# Patient Record
Sex: Female | Born: 1990 | Race: Black or African American | Hispanic: No | Marital: Single | Smoking: Former smoker
Health system: Southern US, Community
[De-identification: ages and names within clinical notes are randomized; demographics above are authoritative.]

## PROBLEM LIST (undated history)

## (undated) DIAGNOSIS — K219 Gastro-esophageal reflux disease without esophagitis: Secondary | ICD-10-CM

## (undated) DIAGNOSIS — J45909 Unspecified asthma, uncomplicated: Secondary | ICD-10-CM

## (undated) DIAGNOSIS — I10 Essential (primary) hypertension: Secondary | ICD-10-CM

## (undated) DIAGNOSIS — E119 Type 2 diabetes mellitus without complications: Secondary | ICD-10-CM

## (undated) HISTORY — DX: Gastro-esophageal reflux disease without esophagitis: K21.9

## (undated) HISTORY — DX: Essential (primary) hypertension: I10

## (undated) HISTORY — DX: Type 2 diabetes mellitus without complications: E11.9

## (undated) HISTORY — PX: HERNIA REPAIR: SHX51

## (undated) HISTORY — DX: Unspecified asthma, uncomplicated: J45.909

---

## 2010-08-19 ENCOUNTER — Emergency Department: Payer: Self-pay | Admitting: Unknown Physician Specialty

## 2015-08-11 ENCOUNTER — Encounter: Payer: Self-pay | Admitting: Emergency Medicine

## 2015-08-11 ENCOUNTER — Emergency Department
Admission: EM | Admit: 2015-08-11 | Discharge: 2015-08-11 | Disposition: A | Discharge status: Home / Self Care | Payer: BLUE CROSS/BLUE SHIELD | Attending: Emergency Medicine | Admitting: Emergency Medicine

## 2015-08-11 DIAGNOSIS — J0100 Acute maxillary sinusitis, unspecified: Secondary | ICD-10-CM | POA: Insufficient documentation

## 2015-08-11 DIAGNOSIS — F17200 Nicotine dependence, unspecified, uncomplicated: Secondary | ICD-10-CM | POA: Diagnosis not present

## 2015-08-11 DIAGNOSIS — R0981 Nasal congestion: Secondary | ICD-10-CM | POA: Diagnosis present

## 2015-08-11 MED ORDER — HYDROCOD POLST-CPM POLST ER 10-8 MG/5ML PO SUER
5.0000 mL | Freq: Once | ORAL | Status: AC
  Administered 2015-08-11: 5 mL via ORAL
  Filled 2015-08-11: qty 5

## 2015-08-11 MED ORDER — HYDROCOD POLST-CPM POLST ER 10-8 MG/5ML PO SUER: 5.0000 mL | Freq: Two times a day (BID) | ORAL | Status: DC

## 2015-08-11 MED ORDER — AZITHROMYCIN 250 MG PO TABS
500.0000 mg | ORAL_TABLET | Freq: Once | ORAL | Status: AC
  Administered 2015-08-11: 500 mg via ORAL
  Filled 2015-08-11: qty 2

## 2015-08-11 MED ORDER — AMOXICILLIN-POT CLAVULANATE 875-125 MG PO TABS: 1.0000 | ORAL_TABLET | Freq: Two times a day (BID) | ORAL | Status: AC

## 2015-08-11 NOTE — ED Notes (Signed)
Pt reports being sick for the last couple of weeks and has led up to tonight where she is having severe head pain 10/10 and feels she is having difficulty breathing.

## 2015-08-11 NOTE — Discharge Instructions (Signed)
Sinusitis, Adult  Sinusitis is redness, soreness, and inflammation of the paranasal sinuses. Paranasal sinuses are air pockets within the bones of your face. They are located beneath your eyes, in the middle of your forehead, and above your eyes. In healthy paranasal sinuses, mucus is able to drain out, and air is able to circulate through them by way of your nose. However, when your paranasal sinuses are inflamed, mucus and air can become trapped. This can allow bacteria and other germs to grow and cause infection.  Sinusitis can develop quickly and last only a short time (acute) or continue over a long period (chronic). Sinusitis that lasts for more than 12 weeks is considered chronic.  CAUSES  Causes of sinusitis include:  · Allergies.  · Structural abnormalities, such as displacement of the cartilage that separates your nostrils (deviated septum), which can decrease the air flow through your nose and sinuses and affect sinus drainage.  · Functional abnormalities, such as when the small hairs (cilia) that line your sinuses and help remove mucus do not work properly or are not present.  SIGNS AND SYMPTOMS  Symptoms of acute and chronic sinusitis are the same. The primary symptoms are pain and pressure around the affected sinuses. Other symptoms include:  · Upper toothache.  · Earache.  · Headache.  · Bad breath.  · Decreased sense of smell and taste.  · A cough, which worsens when you are lying flat.  · Fatigue.  · Fever.  · Thick drainage from your nose, which often is green and may contain pus (purulent).  · Swelling and warmth over the affected sinuses.  DIAGNOSIS  Your health care provider will perform a physical exam. During your exam, your health care provider may perform any of the following to help determine if you have acute sinusitis or chronic sinusitis:  · Look in your nose for signs of abnormal growths in your nostrils (nasal polyps).  · Tap over the affected sinus to check for signs of  infection.  · View the inside of your sinuses using an imaging device that has a light attached (endoscope).  If your health care provider suspects that you have chronic sinusitis, one or more of the following tests may be recommended:  · Allergy tests.  · Nasal culture. A sample of mucus is taken from your nose, sent to a lab, and screened for bacteria.  · Nasal cytology. A sample of mucus is taken from your nose and examined by your health care provider to determine if your sinusitis is related to an allergy.  TREATMENT  Most cases of acute sinusitis are related to a viral infection and will resolve on their own within 10 days. Sometimes, medicines are prescribed to help relieve symptoms of both acute and chronic sinusitis. These may include pain medicines, decongestants, nasal steroid sprays, or saline sprays.  However, for sinusitis related to a bacterial infection, your health care provider will prescribe antibiotic medicines. These are medicines that will help kill the bacteria causing the infection.  Rarely, sinusitis is caused by a fungal infection. In these cases, your health care provider will prescribe antifungal medicine.  For some cases of chronic sinusitis, surgery is needed. Generally, these are cases in which sinusitis recurs more than 3 times per year, despite other treatments.  HOME CARE INSTRUCTIONS  · Drink plenty of water. Water helps thin the mucus so your sinuses can drain more easily.  · Use a humidifier.  · Inhale steam 3-4 times a day (for   example, sit in the bathroom with the shower running).  · Apply a warm, moist washcloth to your face 3-4 times a day, or as directed by your health care provider.  · Use saline nasal sprays to help moisten and clean your sinuses.  · Take medicines only as directed by your health care provider.  · If you were prescribed either an antibiotic or antifungal medicine, finish it all even if you start to feel better.  SEEK IMMEDIATE MEDICAL CARE IF:  · You have  increasing pain or severe headaches.  · You have nausea, vomiting, or drowsiness.  · You have swelling around your face.  · You have vision problems.  · You have a stiff neck.  · You have difficulty breathing.     This information is not intended to replace advice given to you by your health care provider. Make sure you discuss any questions you have with your health care provider.     Document Released: 09/02/2005 Document Revised: 09/23/2014 Document Reviewed: 09/17/2011  Elsevier Interactive Patient Education ©2016 [employer]  Sinus Rinse  WHAT IS A SINUS RINSE?  A sinus rinse is a simple home treatment that is used to rinse your sinuses with a sterile mixture of salt and water (saline solution). Sinuses are air-filled spaces in your skull behind the bones of your face and forehead that open into your nasal cavity.  You will use the following:  · Saline solution.  · Neti pot or spray bottle. This releases the saline solution into your nose and through your sinuses. Neti pots and spray bottles can be purchased at your local pharmacy, a health food store, or online.  WHEN WOULD I DO A SINUS RINSE?  A sinus rinse can help to clear mucus, dirt, dust, or pollen from the nasal cavity. You may do a sinus rinse when you have a cold, a virus, nasal allergy symptoms, a sinus infection, or stuffiness in the nose or sinuses.  If you are considering a sinus rinse:  · Ask your child's health care provider before performing a sinus rinse on your child.  · Do not do a sinus rinse if you have had ear or nasal surgery, ear infection, or blocked ears.  HOW DO I DO A SINUS RINSE?  · Wash your hands.  · Disinfect your device according to the directions provided and then dry it.  · Use the solution that comes with your device or one that is sold separately in stores. Follow the mixing directions on the package.  · Fill your device with the amount of saline solution as directed by the device instructions.  · Stand over a sink and  tilt your head sideways over the sink.  · Place the spout of the device in your upper nostril (the one closer to the ceiling).  · Gently pour or squeeze the saline solution into the nasal cavity. The liquid should drain to the lower nostril if you are not overly congested.  · Gently blow your nose. Blowing too hard may cause ear pain.  · Repeat in the other nostril.  · Clean and rinse your device with clean water and then air-dry it.  ARE THERE RISKS OF A SINUS RINSE?   Sinus rinse is generally very safe and effective. However, there are a few risks, which include:   · A burning sensation in the sinuses. This may happen if you do not make the saline solution as directed. Make sure to follow all directions when   making the saline solution.  · Infection from contaminated water. This is rare, but possible.  · Nasal irritation.     This information is not intended to replace advice given to you by your health care provider. Make sure you discuss any questions you have with your health care provider.     Document Released: 03/30/2014 Document Reviewed: 03/30/2014  Elsevier Interactive Patient Education ©2016 [employer]

## 2015-08-11 NOTE — ED Provider Notes (Signed)
[facility] Provider Note     Time seen: ----------------------------------------- 8:56 PM on 08/11/2015 -----------------------------------------    I have reviewed the triage vital signs and the nursing notes.   HISTORY  Chief Complaint Nasal Congestion and Headache    HPI Betty Villarreal is a 24 y.o. female who presents to ER after she has been sick for several weeks with nasal ingestion and cough. She said severe headache 10 out of 10 and she feels like she's having difficulty breathing with sinus pressure and congestion. Patient states over-the-counter medicine hasn't helped.   History reviewed. No pertinent past medical history.  There are no active problems to display for this patient.   Past Surgical History  Procedure Laterality Date  . Hernia repair      Allergies Review of patient's allergies indicates no known allergies.  Social History Social History  Substance Use Topics  . Smoking status: Light Tobacco Smoker  . Smokeless tobacco: None  . Alcohol Use: No    Review of Systems Constitutional: Negative for fever. Eyes: Negative for visual changes. ENT: Positive for nasal congestion Cardiovascular: Negative for chest pain. Respiratory: Negative for shortness of breath. Positive for cough Gastrointestinal: Negative for abdominal pain, vomiting and diarrhea. Genitourinary: Negative for dysuria. Musculoskeletal: Negative for back pain. Skin: Negative for rash. Neurological: Positive for headaches, negative for focal weakness or numbness.  10-point ROS otherwise negative.  ____________________________________________   PHYSICAL EXAM:  VITAL SIGNS: ED Triage Vitals  Enc Vitals Group     BP 08/11/15 2046 157/96 mmHg     Pulse Rate 08/11/15 2046 100     Resp 08/11/15 2046 20     Temp 08/11/15 2046 98.2 F (36.8 C)     Temp Source 08/11/15 2046 Oral     SpO2 08/11/15 2046 100 %     Weight 08/11/15  2046 215 lb (97.523 kg)     Height 08/11/15 2046  (1.676 m)     Head Cir --      Peak Flow --      Pain Score 08/11/15 2047 10     Pain Loc --      Pain Edu? --      Excl. in GC? --     Constitutional: Alert and oriented. Well appearing and in no distress. Eyes: Conjunctivae are normal. PERRL. Normal extraocular movements. ENT   Head: Normocephalic and atraumatic.   Nose: Moderate to severe nasal passage congestion and erythema   Mouth/Throat: Mucous membranes are moist.   Neck: No stridor. Cardiovascular: Normal rate, regular rhythm. Normal and symmetric distal pulses are present in all extremities. No murmurs, rubs, or gallops. Respiratory: Normal respiratory effort without tachypnea nor retractions. Breath sounds are clear and equal bilaterally. No wheezes/rales/rhonchi. Gastrointestinal: Soft and nontender. No distention. No abdominal bruits.  Musculoskeletal: Nontender with normal range of motion in all extremities. No joint effusions.  No lower extremity tenderness nor edema. Neurologic:  Normal speech and language. No gross focal neurologic deficits are appreciated. Speech is normal. No gait instability. Skin:  Skin is warm, dry and intact. No rash noted. Psychiatric: Mood and affect are normal. Speech and behavior are normal. Patient exhibits appropriate insight and judgment. ____________________________________________  ED COURSE:  Pertinent labs & imaging results that were available during my care of the patient were reviewed by me and considered in my medical decision making (see chart for details). Patient's in no acute distress, will place on antibiotics and cough suppressants or decongestants.  ___________________________________________  FINAL ASSESSMENT AND PLAN  Sinusitis  Plan: Patient with labs and imaging as dictated above. Patient with 2 weeks of upper respiratory symptoms and sinusitis. Will place on antibiotics and give cough suppressants  with decongestants.   Emily FilbertWilliams, Jonathan E, MD   Emily FilbertJonathan E Williams, MD 08/11/15 [phone]

## 2015-10-31 ENCOUNTER — Emergency Department
Admission: EM | Admit: 2015-10-31 | Discharge: 2015-10-31 | Disposition: A | Discharge status: Home / Self Care | Payer: BLUE CROSS/BLUE SHIELD | Attending: Emergency Medicine | Admitting: Emergency Medicine

## 2015-10-31 ENCOUNTER — Emergency Department: Payer: BLUE CROSS/BLUE SHIELD

## 2015-10-31 ENCOUNTER — Encounter: Payer: Self-pay | Admitting: *Deleted

## 2015-10-31 DIAGNOSIS — Z79899 Other long term (current) drug therapy: Secondary | ICD-10-CM | POA: Diagnosis not present

## 2015-10-31 DIAGNOSIS — F17200 Nicotine dependence, unspecified, uncomplicated: Secondary | ICD-10-CM | POA: Insufficient documentation

## 2015-10-31 DIAGNOSIS — O9989 Other specified diseases and conditions complicating pregnancy, childbirth and the puerperium: Secondary | ICD-10-CM | POA: Diagnosis not present

## 2015-10-31 DIAGNOSIS — Z3A08 8 weeks gestation of pregnancy: Secondary | ICD-10-CM | POA: Diagnosis not present

## 2015-10-31 DIAGNOSIS — O26899 Other specified pregnancy related conditions, unspecified trimester: Secondary | ICD-10-CM

## 2015-10-31 DIAGNOSIS — O210 Mild hyperemesis gravidarum: Secondary | ICD-10-CM | POA: Diagnosis not present

## 2015-10-31 DIAGNOSIS — Z3491 Encounter for supervision of normal pregnancy, unspecified, first trimester: Secondary | ICD-10-CM

## 2015-10-31 DIAGNOSIS — R102 Pelvic and perineal pain: Secondary | ICD-10-CM | POA: Diagnosis not present

## 2015-10-31 DIAGNOSIS — O99331 Smoking (tobacco) complicating pregnancy, first trimester: Secondary | ICD-10-CM | POA: Insufficient documentation

## 2015-10-31 LAB — URINALYSIS COMPLETE WITH MICROSCOPIC (ARMC ONLY)
Bacteria, UA: NONE SEEN
Bilirubin Urine: NEGATIVE
Glucose, UA: NEGATIVE mg/dL
HGB URINE DIPSTICK: NEGATIVE
Ketones, ur: NEGATIVE mg/dL
LEUKOCYTES UA: NEGATIVE
NITRITE: NEGATIVE
PH: 6 (ref 5.0–8.0)
PROTEIN: NEGATIVE mg/dL
RBC / HPF: NONE SEEN RBC/hpf (ref 0–5)
SPECIFIC GRAVITY, URINE: 1.018 (ref 1.005–1.030)

## 2015-10-31 LAB — COMPREHENSIVE METABOLIC PANEL
ALK PHOS: 59 U/L (ref 38–126)
ALT: 11 U/L — AB (ref 14–54)
AST: 12 U/L — AB (ref 15–41)
Albumin: 3.5 g/dL (ref 3.5–5.0)
Anion gap: 6 (ref 5–15)
BILIRUBIN TOTAL: 0.2 mg/dL — AB (ref 0.3–1.2)
BUN: 9 mg/dL (ref 6–20)
CALCIUM: 9.1 mg/dL (ref 8.9–10.3)
CO2: 26 mmol/L (ref 22–32)
CREATININE: 0.61 mg/dL (ref 0.44–1.00)
Chloride: 103 mmol/L (ref 101–111)
Glucose, Bld: 152 mg/dL — ABNORMAL HIGH (ref 65–99)
Potassium: 4 mmol/L (ref 3.5–5.1)
Sodium: 135 mmol/L (ref 135–145)
TOTAL PROTEIN: 7.9 g/dL (ref 6.5–8.1)

## 2015-10-31 LAB — POCT PREGNANCY, URINE: PREG TEST UR: POSITIVE — AB

## 2015-10-31 LAB — CBC
HCT: 34.3 % — ABNORMAL LOW (ref 35.0–47.0)
Hemoglobin: 11.2 g/dL — ABNORMAL LOW (ref 12.0–16.0)
MCH: 25.9 pg — ABNORMAL LOW (ref 26.0–34.0)
MCHC: 32.5 g/dL (ref 32.0–36.0)
MCV: 79.6 fL — AB (ref 80.0–100.0)
PLATELETS: 220 10*3/uL (ref 150–440)
RBC: 4.31 MIL/uL (ref 3.80–5.20)
RDW: 14.6 % — AB (ref 11.5–14.5)
WBC: 12.9 10*3/uL — AB (ref 3.6–11.0)

## 2015-10-31 LAB — LIPASE, BLOOD: Lipase: 35 U/L (ref 11–51)

## 2015-10-31 LAB — HCG, QUANTITATIVE, PREGNANCY: HCG, BETA CHAIN, QUANT, S: 33863 m[IU]/mL — AB (ref <5)

## 2015-10-31 NOTE — ED Notes (Signed)
Pt reports she has low abd pain and vomited.  Sx for 1 month.  Vomited x 2 today.  Intermittent back pain.  No vag bleeding.

## 2015-10-31 NOTE — Discharge Instructions (Signed)
First Trimester of Pregnancy The first trimester of pregnancy is from week 1 until the end of week 12 (months 1 through 3). A week after a sperm fertilizes an egg, the egg will implant on the wall of the uterus. This embryo will begin to develop into a baby. Genes from you and your partner are forming the baby. The female genes determine whether the baby is a boy or a girl. At 6-8 weeks, the eyes and face are formed, and the heartbeat can be seen on ultrasound. At the end of 12 weeks, all the baby's organs are formed.  Now that you are pregnant, you will want to do everything you can to have a healthy baby. Two of the most important things are to get good prenatal care and to follow your health care provider's instructions. Prenatal care is all the medical care you receive before the baby's birth. This care will help prevent, find, and treat any problems during the pregnancy and childbirth. BODY CHANGES Your body goes through many changes during pregnancy. The changes vary from woman to woman.   You may gain or lose a couple of pounds at first.  You may feel sick to your stomach (nauseous) and throw up (vomit). If the vomiting is uncontrollable, call your health care provider.  You may tire easily.  You may develop headaches that can be relieved by medicines approved by your health care provider.  You may urinate more often. Painful urination may mean you have a bladder infection.  You may develop heartburn as a result of your pregnancy.  You may develop constipation because certain hormones are causing the muscles that push waste through your intestines to slow down.  You may develop hemorrhoids or swollen, bulging veins (varicose veins).  Your breasts may begin to grow larger and become tender. Your nipples may stick out more, and the tissue that surrounds them (areola) may become darker.  Your gums may bleed and may be sensitive to brushing and flossing.  Dark spots or blotches (chloasma,  mask of pregnancy) may develop on your face. This will likely fade after the baby is born.  Your menstrual periods will stop.  You may have a loss of appetite.  You may develop cravings for certain kinds of food.  You may have changes in your emotions from day to day, such as being excited to be pregnant or being concerned that something may go wrong with the pregnancy and baby.  You may have more vivid and strange dreams.  You may have changes in your hair. These can include thickening of your hair, rapid growth, and changes in texture. Some women also have hair loss during or after pregnancy, or hair that feels dry or thin. Your hair will most likely return to normal after your baby is born. WHAT TO EXPECT AT YOUR PRENATAL VISITS During a routine prenatal visit:  You will be weighed to make sure you and the baby are growing normally.  Your blood pressure will be taken.  Your abdomen will be measured to track your baby's growth.  The fetal heartbeat will be listened to starting around week 10 or 12 of your pregnancy.  Test results from any previous visits will be discussed. Your health care provider may ask you:  How you are feeling.  If you are feeling the baby move.  If you have had any abnormal symptoms, such as leaking fluid, bleeding, severe headaches, or abdominal cramping.  If you are using any tobacco products,   including cigarettes, chewing tobacco, and electronic cigarettes.  If you have any questions. Other tests that may be performed during your first trimester include:  Blood tests to find your blood type and to check for the presence of any previous infections. They will also be used to check for low iron levels (anemia) and Rh antibodies. Later in the pregnancy, blood tests for diabetes will be done along with other tests if problems develop.  Urine tests to check for infections, diabetes, or protein in the urine.  An ultrasound to confirm the proper growth  and development of the baby.  An amniocentesis to check for possible genetic problems.  Fetal screens for spina bifida and Down syndrome.  You may need other tests to make sure you and the baby are doing well.  HIV (human immunodeficiency virus) testing. Routine prenatal testing includes screening for HIV, unless you choose not to have this test. HOME CARE INSTRUCTIONS  Medicines  Follow your health care provider's instructions regarding medicine use. Specific medicines may be either safe or unsafe to take during pregnancy.  Take your prenatal vitamins as directed.  If you develop constipation, try taking a stool softener if your health care provider approves. Diet  Eat regular, well-balanced meals. Choose a variety of foods, such as meat or vegetable-based protein, fish, milk and low-fat dairy products, vegetables, fruits, and whole grain breads and cereals. Your health care provider will help you determine the amount of weight gain that is right for you.  Avoid raw meat and uncooked cheese. These carry germs that can cause birth defects in the baby.  Eating four or five small meals rather than three large meals a day may help relieve nausea and vomiting. If you start to feel nauseous, eating a few soda crackers can be helpful. Drinking liquids between meals instead of during meals also seems to help nausea and vomiting.  If you develop constipation, eat more high-fiber foods, such as fresh vegetables or fruit and whole grains. Drink enough fluids to keep your urine clear or pale yellow. Activity and Exercise  Exercise only as directed by your health care provider. Exercising will help you:  Control your weight.  Stay in shape.  Be prepared for labor and delivery.  Experiencing pain or cramping in the lower abdomen or low back is a good sign that you should stop exercising. Check with your health care provider before continuing normal exercises.  Try to avoid standing for long  periods of time. Move your legs often if you must stand in one place for a long time.  Avoid heavy lifting.  Wear low-heeled shoes, and practice good posture.  You may continue to have sex unless your health care provider directs you otherwise. Relief of Pain or Discomfort  Wear a good support bra for breast tenderness.   Take warm sitz baths to soothe any pain or discomfort caused by hemorrhoids. Use hemorrhoid cream if your health care provider approves.   Rest with your legs elevated if you have leg cramps or low back pain.  If you develop varicose veins in your legs, wear support hose. Elevate your feet for 15 minutes, 3-4 times a day. Limit salt in your diet. Prenatal Care  Schedule your prenatal visits by the twelfth week of pregnancy. They are usually scheduled monthly at first, then more often in the last 2 months before delivery.  Write down your questions. Take them to your prenatal visits.  Keep all your prenatal visits as directed by your   health care provider. Safety  Wear your seat belt at all times when driving.  Make a list of emergency phone numbers, including numbers for family, friends, the hospital, and police and fire departments. General Tips  Ask your health care provider for a referral to a local prenatal education class. Begin classes no later than at the beginning of month 6 of your pregnancy.  Ask for help if you have counseling or nutritional needs during pregnancy. Your health care provider can offer advice or refer you to specialists for help with various needs.  Do not use hot tubs, steam rooms, or saunas.  Do not douche or use tampons or scented sanitary pads.  Do not cross your legs for long periods of time.  Avoid cat litter boxes and soil used by cats. These carry germs that can cause birth defects in the baby and possibly loss of the fetus by miscarriage or stillbirth.  Avoid all smoking, herbs, alcohol, and medicines not prescribed by  your health care provider. Chemicals in these affect the formation and growth of the baby.  Do not use any tobacco products, including cigarettes, chewing tobacco, and electronic cigarettes. If you need help quitting, ask your health care provider. You may receive counseling support and other resources to help you quit.  Schedule a dentist appointment. At home, brush your teeth with a soft toothbrush and be gentle when you floss. SEEK MEDICAL CARE IF:   You have dizziness.  You have mild pelvic cramps, pelvic pressure, or nagging pain in the abdominal area.  You have persistent nausea, vomiting, or diarrhea.  You have a bad smelling vaginal discharge.  You have pain with urination.  You notice increased swelling in your face, hands, legs, or ankles. SEEK IMMEDIATE MEDICAL CARE IF:   You have a fever.  You are leaking fluid from your vagina.  You have spotting or bleeding from your vagina.  You have severe abdominal cramping or pain.  You have rapid weight gain or loss.  You vomit blood or material that looks like coffee grounds.  You are exposed to German measles and have never had them.  You are exposed to fifth disease or chickenpox.  You develop a severe headache.  You have shortness of breath.  You have any kind of trauma, such as from a fall or a car accident.   This information is not intended to replace advice given to you by your health care provider. Make sure you discuss any questions you have with your health care provider.   Document Released: 08/27/2001 Document Revised: 09/23/2014 Document Reviewed: 07/13/2013 Elsevier Interactive Patient Education 2016 [employer]  

## 2015-10-31 NOTE — ED Provider Notes (Signed)
[facility] Emergency Department Provider Note  ____________________________________________  Time seen: 4:00 AM  I have reviewed the triage vital signs and the nursing notes.   HISTORY  Chief Complaint Emesis      HPI Betty Villarreal is a 25 y.o. female presents with low pelvic discomfort and vomiting times one month. Patient denies any fever no nausea vomiting no vaginal discharge or bleeding. Patient denies any urinary symptoms. Of note patient's last menstrual period was December 2016. Patient admits to unprotected sexual intercourse     No past medical history on file.  There are no active problems to display for this patient.   Past Surgical History  Procedure Laterality Date  . Hernia repair      Current Outpatient Rx  Name  Route  Sig  Dispense  Refill  . chlorpheniramine-HYDROcodone (TUSSIONEX PENNKINETIC ER) 10-8 MG/5ML SUER   Oral   Take 5 mLs by mouth 2 (two) times daily.   140 mL   0     Allergies Review of patient's allergies indicates no known allergies.  No family history on file.  Social History Social History  Substance Use Topics  . Smoking status: Light Tobacco Smoker  . Smokeless tobacco: None  . Alcohol Use: No    Review of Systems  Constitutional: Negative for fever. Eyes: Negative for visual changes. ENT: Negative for sore throat. Cardiovascular: Negative for chest pain. Respiratory: Negative for shortness of breath. Gastrointestinal: Negative for abdominal pain and diarrhea. Positive for vomiting Genitourinary: Positive for pelvic pain Musculoskeletal: Negative for back pain. Skin: Negative for rash. Neurological: Negative for headaches, focal weakness or numbness.   10-point ROS otherwise negative.  ____________________________________________   PHYSICAL EXAM:  VITAL SIGNS: ED Triage Vitals  Enc Vitals Group     BP 10/31/15 0123 141/72 mmHg     Pulse Rate 10/31/15 0123 84     Resp  10/31/15 0123 18     Temp 10/31/15 0123 98.2 F (36.8 C)     Temp Source 10/31/15 0123 Oral     SpO2 10/31/15 0123 98 %     Weight 10/31/15 0123 215 lb (97.523 kg)     Height 10/31/15 0123  (1.676 m)     Head Cir --      Peak Flow --      Pain Score 10/31/15 0125 6     Pain Loc --      Pain Edu? --      Excl. in GC? --      Constitutional: Alert and oriented. Well appearing and in no distress. Eyes: Conjunctivae are normal. PERRL. Normal extraocular movements. ENT   Head: Normocephalic and atraumatic.   Nose: No congestion/rhinnorhea.   Mouth/Throat: Mucous membranes are moist.   Neck: No stridor. Hematological/Lymphatic/Immunilogical: No cervical lymphadenopathy. Cardiovascular: Normal rate, regular rhythm. Normal and symmetric distal pulses are present in all extremities. No murmurs, rubs, or gallops. Respiratory: Normal respiratory effort without tachypnea nor retractions. Breath sounds are clear and equal bilaterally. No wheezes/rales/rhonchi. Gastrointestinal: Soft and nontender. No distention. There is no CVA tenderness. Genitourinary: deferred Musculoskeletal: Nontender with normal range of motion in all extremities. No joint effusions.  No lower extremity tenderness nor edema. Neurologic:  Normal speech and language. No gross focal neurologic deficits are appreciated. Speech is normal.  Skin:  Skin is warm, dry and intact. No rash noted. Psychiatric: Mood and affect are normal. Speech and behavior are normal. Patient exhibits appropriate insight and judgment.  ____________________________________________  LABS (pertinent positives/negatives)  Labs Reviewed  COMPREHENSIVE METABOLIC PANEL - Abnormal; Notable for the following:    Glucose, Bld 152 (*)    AST 12 (*)    ALT 11 (*)    Total Bilirubin 0.2 (*)    All other components within normal limits  CBC - Abnormal; Notable for the following:    WBC 12.9 (*)    Hemoglobin 11.2 (*)    HCT 34.3  (*)    MCV 79.6 (*)    MCH 25.9 (*)    RDW 14.6 (*)    All other components within normal limits  URINALYSIS COMPLETEWITH MICROSCOPIC ([facility] ONLY) - Abnormal; Notable for the following:    Color, Urine YELLOW (*)    APPearance CLEAR (*)    Squamous Epithelial / LPF 0-5 (*)    All other components within normal limits  HCG, QUANTITATIVE, PREGNANCY - Abnormal; Notable for the following:    hCG, Beta Chain, Quant, [place] [address] (*)    All other components within normal limits  POCT PREGNANCY, URINE - Abnormal; Notable for the following:    Preg Test, Ur POSITIVE (*)    All other components within normal limits  LIPASE, BLOOD  POC URINE PREG, ED      RADIOLOGY US Ob Transvaginal (Final result) Result time: 10/31/15 06:22:40   Final result by Rad Results In Interface (10/31/15 06:22:40)   Narrative:   CLINICAL DATA: New diagnosis pregnancy. Vomiting. Estimated gestational age by LMP is 8 weeks 5 days. Quantitative beta HCG is 33,863.  EXAM: OBSTETRIC <14 WK [place] AND TRANSVAGINAL OB US  TECHNIQUE: Both transabdominal and transvaginal ultrasound examinations were performed for complete evaluation of the gestation as well as the maternal uterus, adnexal regions, and pelvic cul-de-sac. Transvaginal technique was performed to assess early pregnancy.  COMPARISON: None.  FINDINGS: Intrauterine gestational sac: A single intrauterine pregnancy is identified.  Yolk sac: Yolk sac is present.  Embryo: Fetal pole is present.  Cardiac Activity: Fetal cardiac activity is observed.  Heart Rate: 168 bpm  CRL: 13.5 mm  7 w  4 d         [place] EDC: 06/14/2016  Subchorionic hemorrhage: None visualized.  Maternal uterus/adnexae: Uterus is retroverted. No myometrial mass lesions are appreciated. Small nabothian cysts in the cervix. Both ovaries are identified and demonstrate normal follicular changes. No abnormal adnexal masses. No free pelvic fluid.  IMPRESSION: Single  intrauterine pregnancy. Estimated gestational age by crown-rump length is 7 weeks 4 days. No acute complication is suggested.   Electronically Signed By: Burman Nieves M.D. On: 10/31/2015 06:22          US OB Comp Less 14 Wks (Final result) Result time: 10/31/15 06:22:40   Procedure changed from [place] MFM OB Comp Less 14 Wks      Final result by Rad Results In Interface (10/31/15 06:22:40)   Narrative:   CLINICAL DATA: New diagnosis pregnancy. Vomiting. Estimated gestational age by LMP is 8 weeks 5 days. Quantitative beta HCG is 33,863.  EXAM: OBSTETRIC <14 WK [place] AND TRANSVAGINAL OB US  TECHNIQUE: Both transabdominal and transvaginal ultrasound examinations were performed for complete evaluation of the gestation as well as the maternal uterus, adnexal regions, and pelvic cul-de-sac. Transvaginal technique was performed to assess early pregnancy.  COMPARISON: None.  FINDINGS: Intrauterine gestational sac: A single intrauterine pregnancy is identified.  Yolk sac: Yolk sac is present.  Embryo: Fetal pole is present.  Cardiac Activity: Fetal cardiac activity is observed.  Heart Rate: 168  bpm  CRL: 13.5 mm  7 w  4 d         [place] EDC: 06/14/2016  Subchorionic hemorrhage: None visualized.  Maternal uterus/adnexae: Uterus is retroverted. No myometrial mass lesions are appreciated. Small nabothian cysts in the cervix. Both ovaries are identified and demonstrate normal follicular changes. No abnormal adnexal masses. No free pelvic fluid.  IMPRESSION: Single intrauterine pregnancy. Estimated gestational age by crown-rump length is 7 weeks 4 days. No acute complication is suggested.   Electronically Signed By: Burman Nieves M.D. On: 10/31/2015 06:22         _   INITIAL IMPRESSION / ASSESSMENT AND PLAN / ED COURSE  Pertinent labs & imaging results that were available during my care of the patient were reviewed by me and  considered in my medical decision making (see chart for details).    ____________________________________________   FINAL CLINICAL IMPRESSION(S) / ED DIAGNOSES  Final diagnoses:  Pelvic pain affecting pregnancy  First trimester pregnancy      Darci Current, MD 10/31/15 [phone]

## 2015-10-31 NOTE — ED Notes (Signed)
Patient with no complaints at this time. Respirations even and unlabored. Skin warm/dry. Discharge instructions reviewed with patient at this time. Patient given opportunity to voice concerns/ask questions. Patient discharged at this time and left Emergency Department with steady gait.   

## 2015-10-31 NOTE — ED Notes (Signed)
poct pregnancy Positive 

## 2015-11-09 ENCOUNTER — Ambulatory Visit (INDEPENDENT_AMBULATORY_CARE_PROVIDER_SITE_OTHER): Payer: Medicaid Other | Admitting: Obstetrics and Gynecology

## 2015-11-09 VITALS — BP 126/74 | HR 92 | Wt 229.0 lb

## 2015-11-09 DIAGNOSIS — Z1389 Encounter for screening for other disorder: Secondary | ICD-10-CM

## 2015-11-09 DIAGNOSIS — R7303 Prediabetes: Secondary | ICD-10-CM

## 2015-11-09 DIAGNOSIS — Z113 Encounter for screening for infections with a predominantly sexual mode of transmission: Secondary | ICD-10-CM

## 2015-11-09 DIAGNOSIS — Z331 Pregnant state, incidental: Secondary | ICD-10-CM

## 2015-11-09 DIAGNOSIS — Z36 Encounter for antenatal screening of mother: Secondary | ICD-10-CM

## 2015-11-09 DIAGNOSIS — Z3490 Encounter for supervision of normal pregnancy, unspecified, unspecified trimester: Secondary | ICD-10-CM

## 2015-11-09 DIAGNOSIS — R638 Other symptoms and signs concerning food and fluid intake: Secondary | ICD-10-CM

## 2015-11-09 DIAGNOSIS — Z369 Encounter for antenatal screening, unspecified: Secondary | ICD-10-CM

## 2015-11-09 NOTE — Progress Notes (Signed)
Betty Villarreal presents for NOB nurse interview visit. G-1. P-0. Pt seen in ER on 10/31/2015 for pelvic pain and vomiting and found out she was pregnant.UPT: positive, Bhcg: 11,863. Pregnancy education material explained and given. No cats in the home. NOB labs ordered. TSH/HbgA1c due to Increased BMI, pt states she "had" diabetes and was to follow diet and thinks it may have been borderline. HIV labs and Drug screen were explained optional and she could opt out of tests but did not decline. Drug screen ordered. PNV encouraged. NT to discuss with provider. Pt. To follow up with provider in 3 weeks for NOB physical. FOB was killed Monday. Pt to come in tomorrow to see provider to discuss some depression and anxiety.  All questions answered.   ZIKA EXPOSURE SCREEN:  The patient has not traveled to a [place] Virus endemic area within the past 6 months, nor has she had unprotected sex with a partner who has travelled to a [place] endemic region within the past 6 months. The patient has been advised to notify us if these factors change any time during this current pregnancy, so adequate testing and monitoring can be initiated.

## 2015-11-10 ENCOUNTER — Encounter: Payer: Medicaid Other | Admitting: Obstetrics and Gynecology

## 2015-11-10 ENCOUNTER — Other Ambulatory Visit: Payer: Self-pay | Admitting: Obstetrics and Gynecology

## 2015-11-10 DIAGNOSIS — R7309 Other abnormal glucose: Secondary | ICD-10-CM

## 2015-11-10 DIAGNOSIS — Z2839 Other underimmunization status: Secondary | ICD-10-CM | POA: Insufficient documentation

## 2015-11-10 DIAGNOSIS — Z283 Underimmunization status: Secondary | ICD-10-CM

## 2015-11-10 DIAGNOSIS — O9989 Other specified diseases and conditions complicating pregnancy, childbirth and the puerperium: Principal | ICD-10-CM

## 2015-11-10 DIAGNOSIS — O09899 Supervision of other high risk pregnancies, unspecified trimester: Secondary | ICD-10-CM

## 2015-11-10 LAB — CBC WITH DIFFERENTIAL/PLATELET
BASOS ABS: 0 10*3/uL (ref 0.0–0.2)
Basos: 0 %
EOS (ABSOLUTE): 0.2 10*3/uL (ref 0.0–0.4)
EOS: 2 %
HEMATOCRIT: 35.1 % (ref 34.0–46.6)
HEMOGLOBIN: 11.4 g/dL (ref 11.1–15.9)
IMMATURE GRANS (ABS): 0 10*3/uL (ref 0.0–0.1)
Immature Granulocytes: 0 %
LYMPHS ABS: 1.9 10*3/uL (ref 0.7–3.1)
LYMPHS: 21 %
MCH: 26.4 pg — ABNORMAL LOW (ref 26.6–33.0)
MCHC: 32.5 g/dL (ref 31.5–35.7)
MCV: 81 fL (ref 79–97)
MONOCYTES: 5 %
Monocytes Absolute: 0.4 10*3/uL (ref 0.1–0.9)
Neutrophils Absolute: 6.5 10*3/uL (ref 1.4–7.0)
Neutrophils: 72 %
Platelets: 247 10*3/uL (ref 150–379)
RBC: 4.32 x10E6/uL (ref 3.77–5.28)
RDW: 14.6 % (ref 12.3–15.4)
WBC: 9 10*3/uL (ref 3.4–10.8)

## 2015-11-10 LAB — RUBELLA ANTIBODY, IGM

## 2015-11-10 LAB — VARICELLA ZOSTER ANTIBODY, IGM: Varicella IgM: <0.91 index (ref 0.00–0.90)

## 2015-11-10 LAB — ANTIBODY SCREEN: ANTIBODY SCREEN: NEGATIVE

## 2015-11-10 LAB — RH TYPE: Rh Factor: POSITIVE

## 2015-11-10 LAB — RPR: RPR Ser Ql: NONREACTIVE

## 2015-11-10 LAB — HEPATITIS B SURFACE ANTIGEN: HEP B S AG: NEGATIVE

## 2015-11-10 LAB — SICKLE CELL SCREEN: SICKLE CELL SCREEN: NEGATIVE

## 2015-11-10 LAB — HEMOGLOBIN A1C
ESTIMATED AVERAGE GLUCOSE: 157 mg/dL
HEMOGLOBIN A1C: 7.1 % — AB (ref 4.8–5.6)

## 2015-11-10 LAB — HIV ANTIBODY (ROUTINE TESTING W REFLEX): HIV Screen 4th Generation wRfx: NONREACTIVE

## 2015-11-10 LAB — ABO

## 2015-11-10 LAB — TSH: TSH: 1.29 u[IU]/mL (ref 0.450–4.500)

## 2015-11-11 LAB — GC/CHLAMYDIA PROBE AMP
Chlamydia trachomatis, NAA: NEGATIVE
Neisseria gonorrhoeae by PCR: NEGATIVE

## 2015-11-11 LAB — CULTURE, OB URINE

## 2015-11-11 LAB — URINE CULTURE, OB REFLEX

## 2015-11-16 LAB — PAIN MGT SCRN (14 DRUGS), UR

## 2015-11-16 LAB — MICROSCOPIC EXAMINATION: CASTS: NONE SEEN /LPF

## 2015-11-16 LAB — URINALYSIS, ROUTINE W REFLEX MICROSCOPIC
BILIRUBIN UA: NEGATIVE
GLUCOSE, UA: NEGATIVE
Ketones, UA: NEGATIVE
LEUKOCYTES UA: NEGATIVE
Nitrite, UA: NEGATIVE
PH UA: 6 (ref 5.0–7.5)
RBC UA: NEGATIVE
Specific Gravity, UA: 1.028 (ref 1.005–1.030)
Urobilinogen, Ur: 0.2 mg/dL (ref 0.2–1.0)

## 2015-11-16 LAB — NICOTINE SCREEN, URINE

## 2015-12-05 ENCOUNTER — Encounter: Payer: Medicaid Other | Admitting: Obstetrics and Gynecology

## 2016-09-13 ENCOUNTER — Encounter (HOSPITAL_COMMUNITY): Payer: Self-pay

## 2017-11-23 IMAGING — US US OB TRANSVAGINAL
1 series · 13 of 28 positions shown · non-contrast
Comparison: None.

CLINICAL DATA: New diagnosis pregnancy. Vomiting. Estimated
gestational age by LMP is 8 weeks 5 days. Quantitative beta HCG is
[DATE].

EXAM:
OBSTETRIC <14 WK US AND TRANSVAGINAL OB US
TECHNIQUE: Both transabdominal and transvaginal ultrasound examinations were
performed for complete evaluation of the gestation as well as the
maternal uterus, adnexal regions, and pelvic cul-de-sac.
Transvaginal technique was performed to assess early pregnancy.

[Series 1: us ob transvaginal · 0.27mm/px · 13 of 113 slices shown]
[im 5/113]
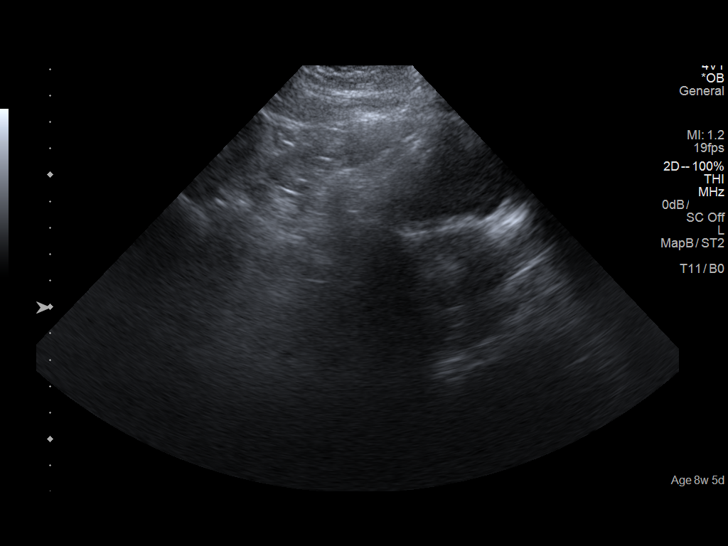
[im 13/113]
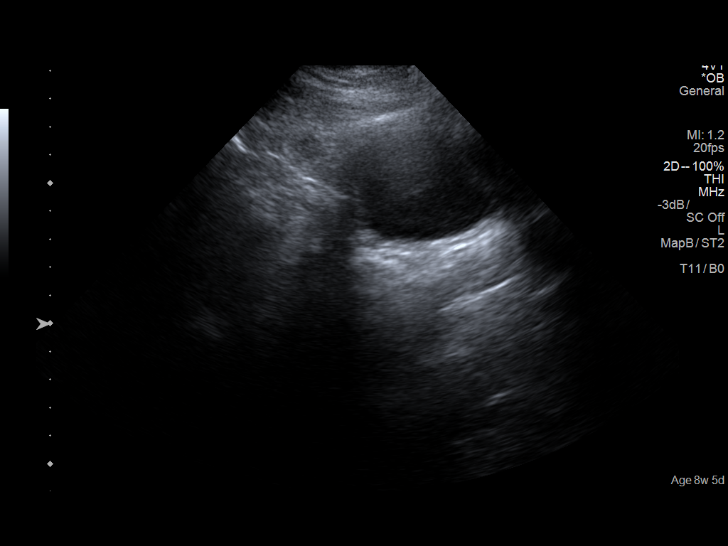
[im 21/113]
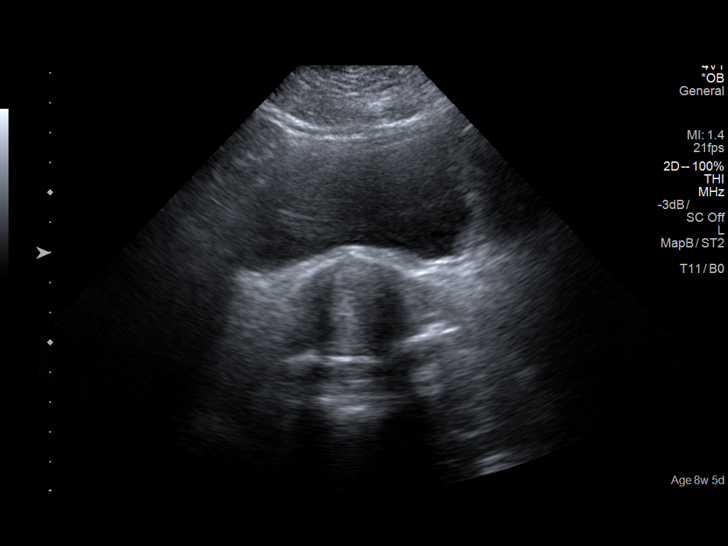
[im 30/113]
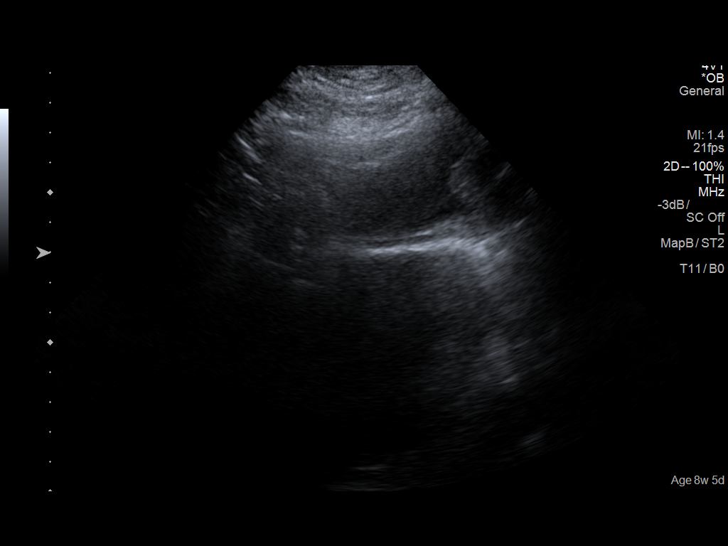
[im 38/113]
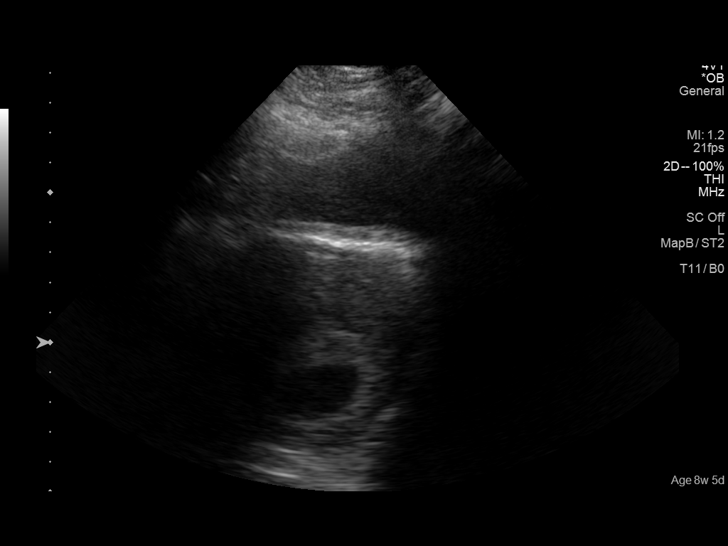
[im 46/113]
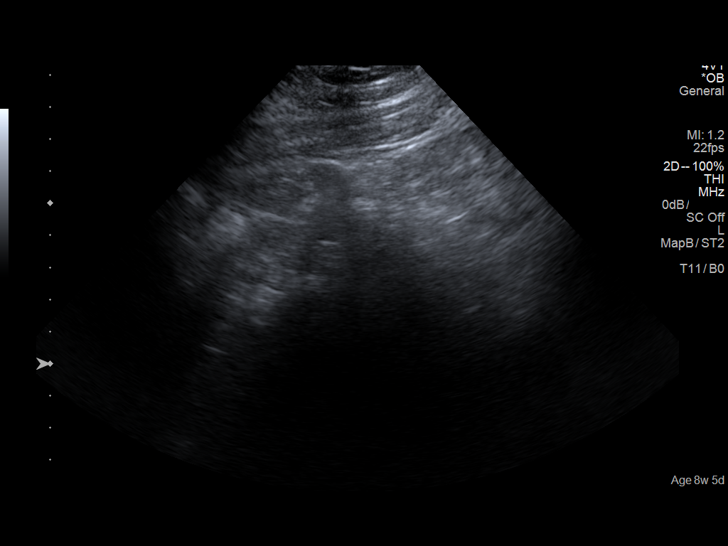
[im 59/113]
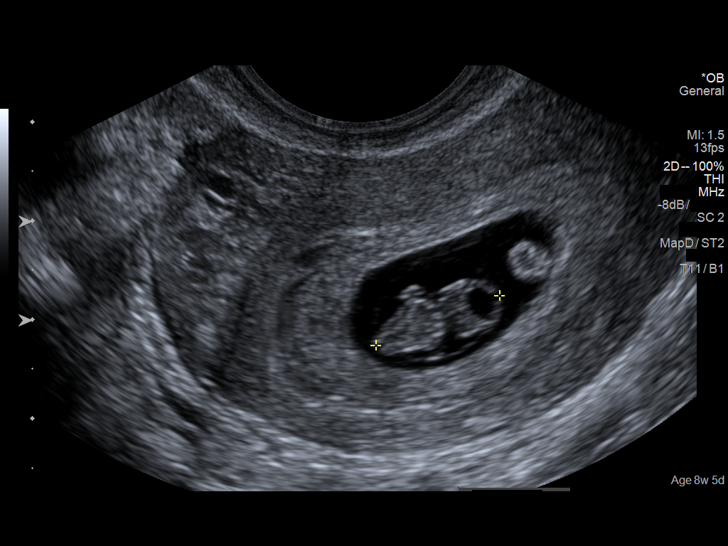
[im 67/113]
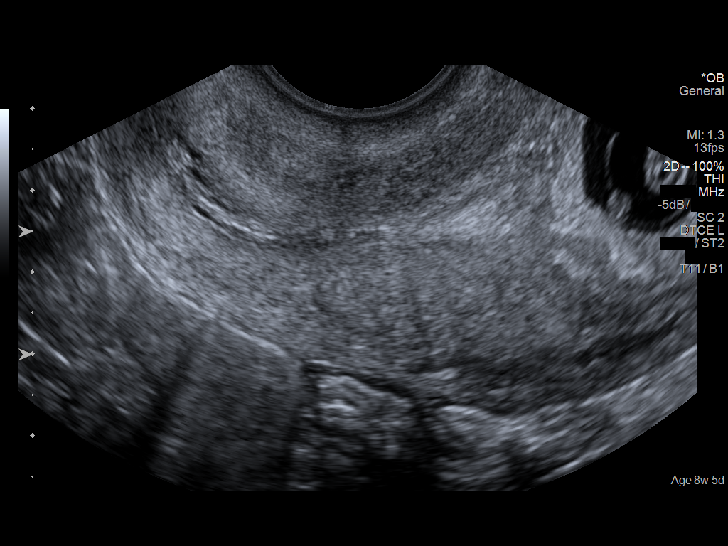
[im 75/113]
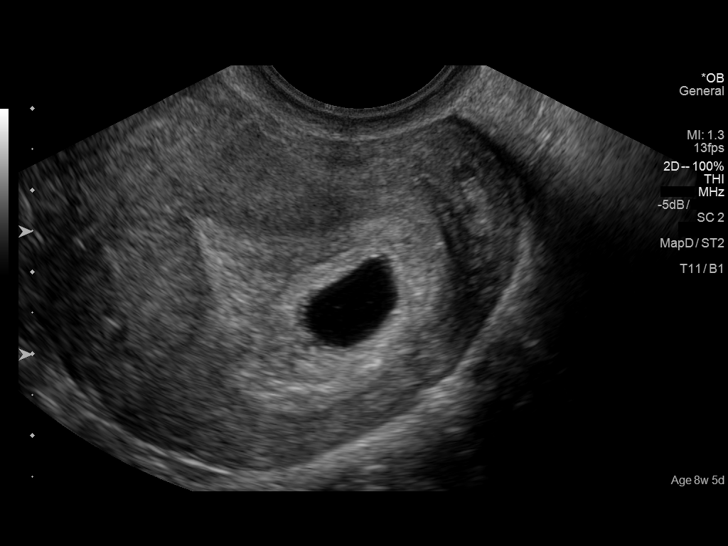
[im 83/113]
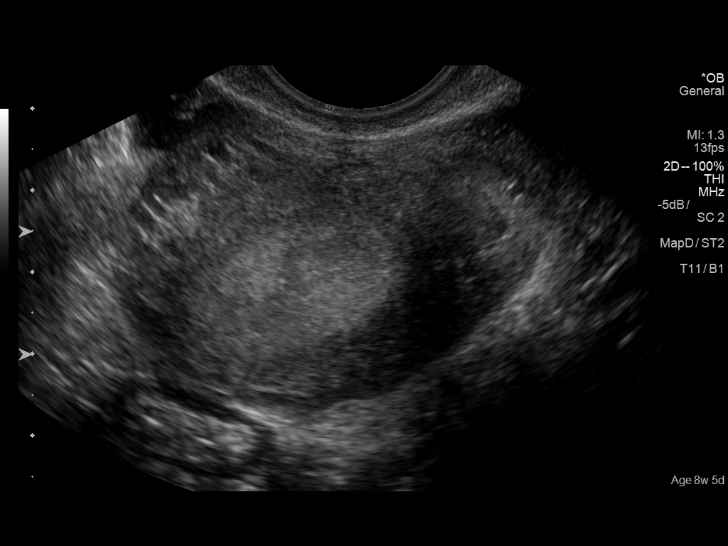
[im 92/113]
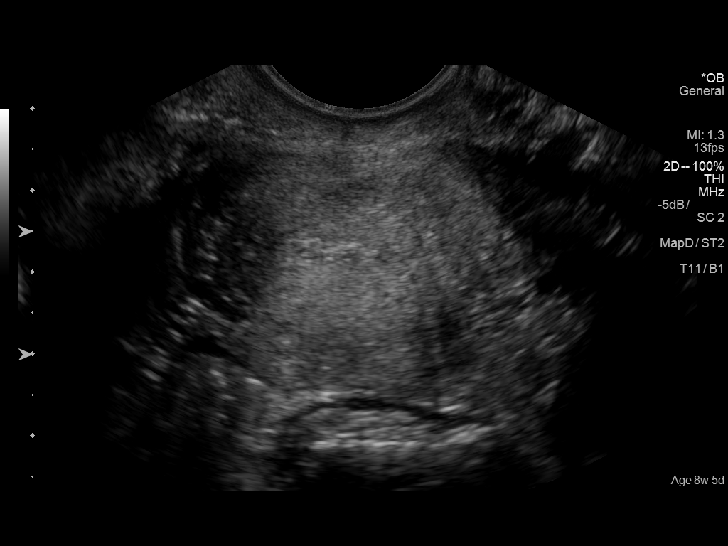
[im 100/113]
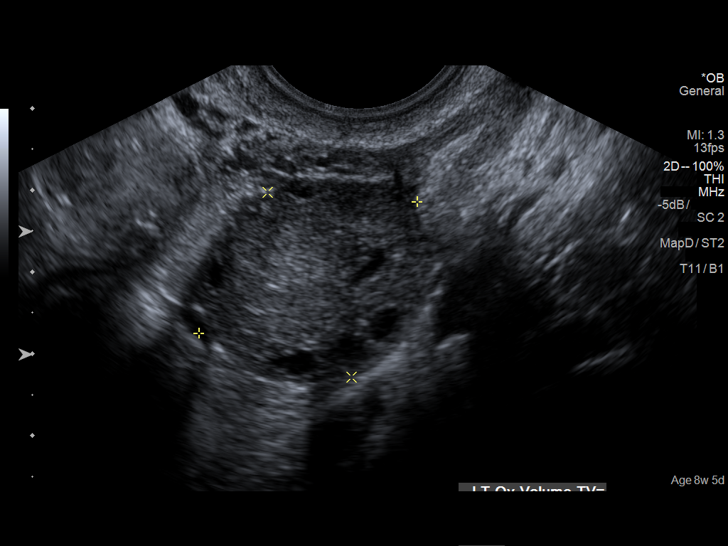
[im 108/113]
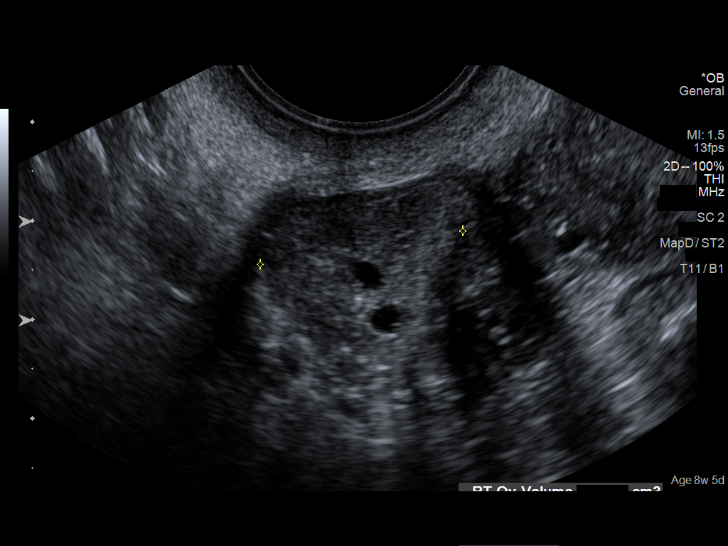

[13 of 28 positions shown; findings below may reference images not displayed]

FINDINGS: Intrauterine gestational sac: A single intrauterine pregnancy is
identified.

Yolk sac:  Yolk sac is present.

Embryo:  Fetal pole is present.

Cardiac Activity: Fetal cardiac activity is observed.

Heart Rate: 168  bpm

CRL:  13.5  mm   7 w   4 d                  US EDC: 06/14/2016

Subchorionic hemorrhage:  None visualized.

Maternal uterus/adnexae: Uterus is retroverted. No myometrial mass
lesions are appreciated. Small nabothian cysts in the cervix. Both
ovaries are identified and demonstrate normal follicular changes. No
abnormal adnexal masses. No free pelvic fluid.
IMPRESSION: Single intrauterine pregnancy. Estimated gestational age by
crown-rump length is 7 weeks 4 days. No acute complication is
suggested.

## 2024-08-18 ENCOUNTER — Emergency Department
Admission: EM | Admit: 2024-08-18 | Discharge: 2024-08-18 | Disposition: A | Discharge status: Home / Self Care | Attending: Emergency Medicine | Admitting: Emergency Medicine

## 2024-08-18 ENCOUNTER — Other Ambulatory Visit: Payer: Self-pay

## 2024-08-18 ENCOUNTER — Emergency Department

## 2024-08-18 DIAGNOSIS — E119 Type 2 diabetes mellitus without complications: Secondary | ICD-10-CM | POA: Diagnosis not present

## 2024-08-18 DIAGNOSIS — Z794 Long term (current) use of insulin: Secondary | ICD-10-CM | POA: Diagnosis not present

## 2024-08-18 DIAGNOSIS — S8255XA Nondisplaced fracture of medial malleolus of left tibia, initial encounter for closed fracture: Secondary | ICD-10-CM | POA: Diagnosis not present

## 2024-08-18 DIAGNOSIS — S82892A Other fracture of left lower leg, initial encounter for closed fracture: Secondary | ICD-10-CM

## 2024-08-18 DIAGNOSIS — W009XXA Unspecified fall due to ice and snow, initial encounter: Secondary | ICD-10-CM | POA: Diagnosis not present

## 2024-08-18 DIAGNOSIS — M25572 Pain in left ankle and joints of left foot: Secondary | ICD-10-CM | POA: Diagnosis present

## 2024-08-18 MED ORDER — OXYCODONE-ACETAMINOPHEN 5-325 MG PO TABS: 1.0000 | ORAL_TABLET | ORAL | 0 refills | Status: AC | PRN

## 2024-08-18 MED ORDER — NAPROXEN 500 MG PO TABS
500.0000 mg | ORAL_TABLET | Freq: Once | ORAL | Status: AC
  Administered 2024-08-18: 500 mg via ORAL
  Filled 2024-08-18: qty 1

## 2024-08-18 MED ORDER — NAPROXEN 500 MG PO TABS: 500.0000 mg | ORAL_TABLET | Freq: Two times a day (BID) | ORAL | 0 refills | Status: AC

## 2024-08-18 MED ORDER — OXYCODONE-ACETAMINOPHEN 5-325 MG PO TABS
1.0000 | ORAL_TABLET | Freq: Once | ORAL | Status: AC
  Administered 2024-08-18: 1 via ORAL
  Filled 2024-08-18: qty 1

## 2024-08-18 NOTE — ED Triage Notes (Signed)
 Pt to ED for fall, slipped on ice this morning, denies hitting head. States left leg went under her. C/o pain to entire left leg.

## 2024-08-18 NOTE — ED Provider Notes (Signed)
 [facility] Provider Note    Event Date/Time   First MD Initiated Contact with Patient 08/18/24 [phone]     (approximate)   History   Fall   HPI  Betty Villarreal is a 33 y.o. female with past history of diabetes who presents with mechanical fall this morning causing pain in left ankle.  Reports that she was in her usual state of health, slipped on ice and fell with her left leg pinned under her body.  Immediate pain and swelling in the left ankle.  No other injuries.  Does not take blood thinners.     Physical Exam   Triage Vital Signs: ED Triage Vitals  Encounter Vitals Group     BP 08/18/24 0835 (!) 154/94     Girls Systolic BP Percentile --      Girls Diastolic BP Percentile --      Boys Systolic BP Percentile --      Boys Diastolic BP Percentile --      Pulse Rate 08/18/24 0835 98     Resp 08/18/24 0835 18     Temp 08/18/24 0835 99.7 F (37.6 C)     Temp src --      SpO2 08/18/24 0835 97 %     Weight 08/18/24 0836 190 lb (86.2 kg)     Height 08/18/24 0836 5' 5 (1.651 m)     Head Circumference --      Peak Flow --      Pain Score 08/18/24 0836 10     Pain Loc --      Pain Education --      Exclude from Growth Chart --     Most recent vital signs: Vitals:   08/18/24 0835  BP: (!) 154/94  Pulse: 98  Resp: 18  Temp: 99.7 F (37.6 C)  SpO2: 97%    General: Awake, no distress.  CV:  Good peripheral perfusion.  Normal DP pulse and distal cap refill Resp:  Normal effort.  Abd:  No distention.  Other:  Tenderness at the left proximal tibia and diffusely at the ankle.  There is diffuse ankle swelling.   ED Results / Procedures / Treatments   Labs (all labs ordered are listed, but only abnormal results are displayed) Labs Reviewed - No data to display   RADIOLOGY X-ray right ankle interpreted by me, shows subtle irregularity at the distal tip of the tibia.  Also hairline fracture of the distal fibula.  Radiology report  reviewed  X-ray left knee interpreted by me, unremarkable.  Radiology report reviewed   PROCEDURES:  Procedures   MEDICATIONS ORDERED IN ED: Medications  oxyCODONE-acetaminophen (PERCOCET/ROXICET) 5-325 MG per tablet 1 tablet (1 tablet Oral Given 08/18/24 0902)  naproxen (NAPROSYN) tablet 500 mg (500 mg Oral Given 08/18/24 0902)     IMPRESSION / MDM / ASSESSMENT AND PLAN / ED COURSE  I reviewed the triage vital signs and the nursing notes.                              Differential diagnosis includes, but is not limited to, ankle fracture, proximal fibular fracture, tibial plateau fracture, ankle sprain    ----------------------------------------- 10:28 AM on 08/18/2024 ----------------------------------------- X-rays show tiny avulsion fracture at medial tip of the distal tibia.  Possible nondisplaced hairline fracture of distal fibula.  3 side short leg splint applied, crutches, nonweightbearing.  Follow-up with orthopedics.  No lacerations,  low risk for compartment syndrome     FINAL CLINICAL IMPRESSION(S) / ED DIAGNOSES   Final diagnoses:  Ankle fracture, left, closed, initial encounter  Type 2 diabetes mellitus without complication, with long-term current use of insulin (HCC)     Rx / DC Orders   ED Discharge Orders          Ordered    naproxen (NAPROSYN) 500 MG tablet  2 times daily with meals        08/18/24 0956    oxyCODONE-acetaminophen (PERCOCET) 5-325 MG tablet  Every 4 hours PRN        08/18/24 0956             Note:  This document was prepared using Dragon voice recognition software and may include unintentional dictation errors.   Viviann Pastor, MD 08/18/24 1030

## 2024-08-18 NOTE — ED Notes (Signed)
 Splint applied to left leg by EDT's.
